# Patient Record
Sex: Male | Born: 1990
Health system: Southern US, Community
[De-identification: ages and names within clinical notes are randomized; demographics above are authoritative.]

## PROBLEM LIST (undated history)

## (undated) DIAGNOSIS — J45909 Unspecified asthma, uncomplicated: Secondary | ICD-10-CM

---

## 2017-01-06 ENCOUNTER — Emergency Department (HOSPITAL_COMMUNITY): Admission: EM | Admit: 2017-01-06 | Discharge: 2017-01-06 | Discharge status: Against Medical Advice | Payer: Self-pay

## 2017-01-06 ENCOUNTER — Other Ambulatory Visit: Payer: Self-pay

## 2017-03-17 ENCOUNTER — Emergency Department (HOSPITAL_COMMUNITY): Payer: No Typology Code available for payment source

## 2017-03-17 ENCOUNTER — Emergency Department (HOSPITAL_COMMUNITY): Admission: EM | Admit: 2017-03-17 | Discharge: 2017-03-17 | Discharge status: Absent without leave | Payer: Self-pay

## 2017-03-17 ENCOUNTER — Emergency Department (HOSPITAL_COMMUNITY)
Admission: EM | Admit: 2017-03-17 | Discharge: 2017-03-17 | Disposition: A | Discharge status: Home / Self Care | Payer: No Typology Code available for payment source | Attending: Emergency Medicine | Admitting: Emergency Medicine

## 2017-03-17 DIAGNOSIS — S59291A Other physeal fracture of lower end of radius, right arm, initial encounter for closed fracture: Secondary | ICD-10-CM | POA: Diagnosis not present

## 2017-03-17 DIAGNOSIS — Y929 Unspecified place or not applicable: Secondary | ICD-10-CM | POA: Insufficient documentation

## 2017-03-17 DIAGNOSIS — Y939 Activity, unspecified: Secondary | ICD-10-CM | POA: Insufficient documentation

## 2017-03-17 DIAGNOSIS — S20219A Contusion of unspecified front wall of thorax, initial encounter: Secondary | ICD-10-CM | POA: Insufficient documentation

## 2017-03-17 DIAGNOSIS — S93402A Sprain of unspecified ligament of left ankle, initial encounter: Secondary | ICD-10-CM | POA: Insufficient documentation

## 2017-03-17 DIAGNOSIS — T07XXXA Unspecified multiple injuries, initial encounter: Secondary | ICD-10-CM | POA: Diagnosis present

## 2017-03-17 DIAGNOSIS — Y999 Unspecified external cause status: Secondary | ICD-10-CM | POA: Diagnosis not present

## 2017-03-17 DIAGNOSIS — S62101A Fracture of unspecified carpal bone, right wrist, initial encounter for closed fracture: Secondary | ICD-10-CM

## 2017-03-17 LAB — CBC WITH DIFFERENTIAL/PLATELET
BASOS ABS: 0.1 10*3/uL (ref 0.0–0.1)
Basophils Relative: 0 %
Eosinophils Absolute: 0 10*3/uL (ref 0.0–0.7)
Eosinophils Relative: 0 %
HCT: 43.9 % (ref 39.0–52.0)
HEMOGLOBIN: 15 g/dL (ref 13.0–17.0)
LYMPHS ABS: 1.3 10*3/uL (ref 0.7–4.0)
LYMPHS PCT: 6 %
MCH: 31.7 pg (ref 26.0–34.0)
MCHC: 34.2 g/dL (ref 30.0–36.0)
MCV: 92.8 fL (ref 78.0–100.0)
Monocytes Absolute: 1.7 10*3/uL — ABNORMAL HIGH (ref 0.1–1.0)
Monocytes Relative: 7 %
NEUTROS PCT: 87 %
Neutro Abs: 20.1 10*3/uL — ABNORMAL HIGH (ref 1.7–7.7)
Platelets: 215 10*3/uL (ref 150–400)
RBC: 4.73 MIL/uL (ref 4.22–5.81)
RDW: 14.4 % (ref 11.5–15.5)
WBC: 23.1 10*3/uL — ABNORMAL HIGH (ref 4.0–10.5)

## 2017-03-17 LAB — BASIC METABOLIC PANEL
ANION GAP: 12 (ref 5–15)
BUN: 13 mg/dL (ref 6–20)
CHLORIDE: 106 mmol/L (ref 101–111)
CO2: 20 mmol/L — ABNORMAL LOW (ref 22–32)
Calcium: 9 mg/dL (ref 8.9–10.3)
Creatinine, Ser: 0.88 mg/dL (ref 0.61–1.24)
GFR calc Af Amer: >60 mL/min (ref >60)
GLUCOSE: 87 mg/dL (ref 65–99)
POTASSIUM: 3.7 mmol/L (ref 3.5–5.1)
SODIUM: 138 mmol/L (ref 135–145)

## 2017-03-17 MED ORDER — NAPROXEN 500 MG PO TABS: 500.0000 mg | ORAL_TABLET | Freq: Two times a day (BID) | ORAL | 0 refills | Status: DC

## 2017-03-17 MED ORDER — OXYCODONE-ACETAMINOPHEN 5-325 MG PO TABS: 2.0000 | ORAL_TABLET | ORAL | 0 refills | Status: DC | PRN

## 2017-03-17 MED ORDER — HYDROCODONE-ACETAMINOPHEN 5-325 MG PO TABS: 1.0000 | ORAL_TABLET | ORAL | 0 refills | Status: DC | PRN

## 2017-03-17 MED ORDER — HYDROCODONE-ACETAMINOPHEN 5-325 MG PO TABS
2.0000 | ORAL_TABLET | Freq: Once | ORAL | Status: AC
  Administered 2017-03-17: 2 via ORAL
  Filled 2017-03-17: qty 2

## 2017-03-17 MED ORDER — FENTANYL CITRATE (PF) 100 MCG/2ML IJ SOLN
100.0000 ug | INTRAMUSCULAR | Status: AC | PRN
  Administered 2017-03-17 (×2): 100 ug via INTRAVENOUS
  Filled 2017-03-17 (×2): qty 2

## 2017-03-17 MED ORDER — ONDANSETRON HCL 4 MG/2ML IJ SOLN
4.0000 mg | Freq: Once | INTRAMUSCULAR | Status: AC
  Administered 2017-03-17: 4 mg via INTRAVENOUS
  Filled 2017-03-17: qty 2

## 2017-03-17 MED ORDER — ONDANSETRON HCL 4 MG/2ML IJ SOLN: 4.0000 mg | Freq: Once | INTRAMUSCULAR | Status: DC

## 2017-03-17 NOTE — Discharge Instructions (Signed)
Wear splint until seen in follow-up by Dr. Amanda PeaGramig.  Call tomorrow for follow-up appointment. Weight-bear as tolerated on left ankle using the Cam walker boot. Naproxen for mild pain, Vicodin for worsening pain.
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
## 2017-03-17 NOTE — ED Notes (Signed)
Bed: [id number] Expected date:  Expected time:  Means of arrival:  Comments: Hold EMS

## 2017-05-08 ENCOUNTER — Emergency Department (HOSPITAL_COMMUNITY)
Admission: EM | Admit: 2017-05-08 | Discharge: 2017-05-08 | Disposition: A | Discharge status: Home / Self Care | Payer: Self-pay | Attending: Emergency Medicine | Admitting: Emergency Medicine

## 2017-05-08 ENCOUNTER — Encounter (HOSPITAL_COMMUNITY): Payer: Self-pay

## 2017-05-08 ENCOUNTER — Emergency Department (HOSPITAL_COMMUNITY): Payer: Self-pay

## 2017-05-08 ENCOUNTER — Other Ambulatory Visit: Payer: Self-pay

## 2017-05-08 DIAGNOSIS — N451 Epididymitis: Secondary | ICD-10-CM | POA: Insufficient documentation

## 2017-05-08 DIAGNOSIS — N50811 Right testicular pain: Secondary | ICD-10-CM

## 2017-05-08 DIAGNOSIS — J45909 Unspecified asthma, uncomplicated: Secondary | ICD-10-CM | POA: Insufficient documentation

## 2017-05-08 DIAGNOSIS — F17210 Nicotine dependence, cigarettes, uncomplicated: Secondary | ICD-10-CM | POA: Insufficient documentation

## 2017-05-08 HISTORY — DX: Unspecified asthma, uncomplicated: J45.909

## 2017-05-08 LAB — URINALYSIS, ROUTINE W REFLEX MICROSCOPIC
BACTERIA UA: NONE SEEN
Bilirubin Urine: NEGATIVE
Glucose, UA: NEGATIVE mg/dL
Ketones, ur: NEGATIVE mg/dL
NITRITE: NEGATIVE
PROTEIN: 100 mg/dL — AB
SQUAMOUS EPITHELIAL / LPF: NONE SEEN
Specific Gravity, Urine: 1.025 (ref 1.005–1.030)
pH: 7 (ref 5.0–8.0)

## 2017-05-08 MED ORDER — DOXYCYCLINE HYCLATE 100 MG PO CAPS: 100.0000 mg | ORAL_CAPSULE | Freq: Two times a day (BID) | ORAL | 0 refills | Status: AC

## 2017-05-08 MED ORDER — DOXYCYCLINE HYCLATE 100 MG PO TABS
100.0000 mg | ORAL_TABLET | Freq: Once | ORAL | Status: AC
  Administered 2017-05-08: 100 mg via ORAL
  Filled 2017-05-08: qty 1

## 2017-05-08 MED ORDER — HYDROMORPHONE HCL 2 MG/ML IJ SOLN
2.0000 mg | Freq: Once | INTRAMUSCULAR | Status: AC
Start: 2017-05-08 — End: 2017-05-08
  Administered 2017-05-08: 2 mg via INTRAVENOUS
  Filled 2017-05-08: qty 1

## 2017-05-08 MED ORDER — HYDROMORPHONE HCL 1 MG/ML IJ SOLN
1.0000 mg | Freq: Once | INTRAMUSCULAR | Status: AC
  Administered 2017-05-08: 1 mg via INTRAVENOUS
  Filled 2017-05-08: qty 1

## 2017-05-08 MED ORDER — NAPROXEN 500 MG PO TABS
500.0000 mg | ORAL_TABLET | Freq: Once | ORAL | Status: AC
  Administered 2017-05-08: 500 mg via ORAL
  Filled 2017-05-08: qty 1

## 2017-05-08 MED ORDER — CEFTRIAXONE SODIUM 250 MG IJ SOLR
250.0000 mg | Freq: Once | INTRAMUSCULAR | Status: AC
  Administered 2017-05-08: 250 mg via INTRAMUSCULAR
  Filled 2017-05-08: qty 250

## 2017-05-08 MED ORDER — HYDROCODONE-ACETAMINOPHEN 5-325 MG PO TABS: 1.0000 | ORAL_TABLET | ORAL | 0 refills | Status: DC | PRN

## 2017-05-08 MED ORDER — STERILE WATER FOR INJECTION IJ SOLN
INTRAMUSCULAR | Status: AC
  Administered 2017-05-08: 0.9 mL
  Filled 2017-05-08: qty 10

## 2017-05-08 MED ORDER — OXYCODONE-ACETAMINOPHEN 5-325 MG PO TABS
2.0000 | ORAL_TABLET | Freq: Once | ORAL | Status: AC
  Administered 2017-05-08: 2 via ORAL
  Filled 2017-05-08: qty 2

## 2017-05-08 MED ORDER — ONDANSETRON 8 MG PO TBDP
8.0000 mg | ORAL_TABLET | Freq: Once | ORAL | Status: AC
  Administered 2017-05-08: 8 mg via ORAL
  Filled 2017-05-08: qty 1

## 2017-05-08 MED ORDER — IBUPROFEN 600 MG PO TABS: 600.0000 mg | ORAL_TABLET | Freq: Four times a day (QID) | ORAL | 0 refills | Status: DC | PRN

## 2017-05-08 NOTE — ED Provider Notes (Signed)
Assumed care at 1500. Patient imaging shows epididymitis. No signs of systemic illness.  Patient given prophylactic antibiotics here and will discharge with outpatient referral.  I discussed the importance of adhering to his full antibiotic regimen and need for outpatient follow-up, as well as STD precautions.  Patient in agreement.  Pt phone: [phone]   Shaune PollackIsaacs, Trezure Cronk, MD 05/08/17 [id number]

## 2017-05-08 NOTE — ED Triage Notes (Signed)
Pt presents today due to testicular pain and swelling since last night. Pt reports waking from his sleep due to pain. Has not taken anything OTC for pain.

## 2017-05-08 NOTE — ED Notes (Signed)
Gave pt a urinal 

## 2017-05-08 NOTE — Discharge Instructions (Signed)
As we discussed, it is very important that you fill and take your antibiotics.  This can prevent serious complications.  Call 1 of the numbers above to set up an appointment with your primary doctor as well as seek assistance with medications as needed.
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
## 2017-05-08 NOTE — ED Notes (Signed)
ULTRASOUND AT BEDSIDE

## 2017-05-08 NOTE — ED Notes (Signed)
ED Provider at bedside. 

## 2017-05-09 LAB — URINE CULTURE: CULTURE: NO GROWTH

## 2017-05-11 ENCOUNTER — Encounter (HOSPITAL_COMMUNITY): Payer: Self-pay | Admitting: *Deleted

## 2017-05-11 ENCOUNTER — Emergency Department (HOSPITAL_COMMUNITY): Payer: Self-pay

## 2017-05-11 DIAGNOSIS — N50811 Right testicular pain: Secondary | ICD-10-CM | POA: Insufficient documentation

## 2017-05-11 DIAGNOSIS — Z5321 Procedure and treatment not carried out due to patient leaving prior to being seen by health care provider: Secondary | ICD-10-CM | POA: Insufficient documentation

## 2017-05-11 NOTE — ED Triage Notes (Signed)
Pt complains of swelling to his right testicle radiating into his right groin for the past 2-3 days. Pt states pain is worse with cough. Pt denies urinary symptoms.

## 2017-05-11 NOTE — ED Notes (Signed)
Called Pt for ultrasound no response from the lobby.

## 2017-05-11 NOTE — ED Notes (Signed)
Called Pt in lobby for vital recheck, no response in lobby x2. 

## 2017-05-12 ENCOUNTER — Emergency Department (HOSPITAL_COMMUNITY)
Admission: EM | Admit: 2017-05-12 | Discharge: 2017-05-12 | Disposition: A | Discharge status: Home / Self Care | Payer: Self-pay | Attending: Emergency Medicine | Admitting: Emergency Medicine

## 2017-05-12 NOTE — ED Notes (Signed)
Pt called. No response.

## 2017-05-14 NOTE — ED Notes (Signed)
05/14/2017, Attempted follow-up call, no answer. 

## 2017-10-30 ENCOUNTER — Other Ambulatory Visit: Payer: Self-pay

## 2017-10-30 ENCOUNTER — Emergency Department (HOSPITAL_COMMUNITY)
Admission: EM | Admit: 2017-10-30 | Discharge: 2017-10-30 | Disposition: A | Discharge status: Home / Self Care | Payer: Self-pay | Attending: Emergency Medicine | Admitting: Emergency Medicine

## 2017-10-30 ENCOUNTER — Emergency Department (HOSPITAL_COMMUNITY): Payer: Self-pay

## 2017-10-30 ENCOUNTER — Encounter (HOSPITAL_COMMUNITY): Payer: Self-pay | Admitting: Emergency Medicine

## 2017-10-30 DIAGNOSIS — F17210 Nicotine dependence, cigarettes, uncomplicated: Secondary | ICD-10-CM | POA: Insufficient documentation

## 2017-10-30 DIAGNOSIS — J45909 Unspecified asthma, uncomplicated: Secondary | ICD-10-CM | POA: Insufficient documentation

## 2017-10-30 DIAGNOSIS — R079 Chest pain, unspecified: Secondary | ICD-10-CM | POA: Insufficient documentation

## 2017-10-30 DIAGNOSIS — R05 Cough: Secondary | ICD-10-CM | POA: Insufficient documentation

## 2017-10-30 DIAGNOSIS — R071 Chest pain on breathing: Secondary | ICD-10-CM | POA: Insufficient documentation

## 2017-10-30 LAB — BASIC METABOLIC PANEL
Anion gap: 6 (ref 5–15)
BUN: 13 mg/dL (ref 6–20)
CO2: 24 mmol/L (ref 22–32)
Calcium: 9.4 mg/dL (ref 8.9–10.3)
Chloride: 111 mmol/L (ref 98–111)
Creatinine, Ser: 1.06 mg/dL (ref 0.61–1.24)
GFR calc Af Amer: >60 mL/min (ref >60)
GFR calc non Af Amer: >60 mL/min (ref >60)
Glucose, Bld: 101 mg/dL — ABNORMAL HIGH (ref 70–99)
Potassium: 4 mmol/L (ref 3.5–5.1)
Sodium: 141 mmol/L (ref 135–145)

## 2017-10-30 LAB — CBC
HCT: 45.7 % (ref 39.0–52.0)
Hemoglobin: 15.6 g/dL (ref 13.0–17.0)
MCH: 31.9 pg (ref 26.0–34.0)
MCHC: 34.1 g/dL (ref 30.0–36.0)
MCV: 93.5 fL (ref 78.0–100.0)
Platelets: 227 10*3/uL (ref 150–400)
RBC: 4.89 MIL/uL (ref 4.22–5.81)
RDW: 13.9 % (ref 11.5–15.5)
WBC: 8.8 10*3/uL (ref 4.0–10.5)

## 2017-10-30 LAB — I-STAT TROPONIN, ED: Troponin i, poc: 0 ng/mL (ref 0.00–0.08)

## 2017-10-30 LAB — D-DIMER, QUANTITATIVE (NOT AT ARMC): D-Dimer, Quant: 0.27 ug/mL-FEU (ref 0.00–0.50)

## 2017-10-30 MED ORDER — MELOXICAM 15 MG PO TABS: 15.0000 mg | ORAL_TABLET | Freq: Every day | ORAL | 0 refills | Status: AC | PRN

## 2017-10-30 MED ORDER — OXYCODONE-ACETAMINOPHEN 5-325 MG PO TABS: 1.0000 | ORAL_TABLET | ORAL | 0 refills | Status: DC | PRN

## 2017-10-30 MED ORDER — HYDROMORPHONE HCL 1 MG/ML IJ SOLN
1.0000 mg | Freq: Once | INTRAMUSCULAR | Status: AC
Start: 2017-10-30 — End: 2017-10-30
  Administered 2017-10-30: 1 mg via INTRAVENOUS
  Filled 2017-10-30: qty 1

## 2017-10-30 MED ORDER — SODIUM CHLORIDE 0.9 % IV SOLN
INTRAVENOUS | Status: DC
  Administered 2017-10-30: 09:00:00 via INTRAVENOUS

## 2017-10-30 MED ORDER — PREDNISONE 20 MG PO TABS: 40.0000 mg | ORAL_TABLET | Freq: Every day | ORAL | 0 refills | Status: DC

## 2017-10-30 MED ORDER — DEXAMETHASONE SODIUM PHOSPHATE 10 MG/ML IJ SOLN
10.0000 mg | Freq: Once | INTRAMUSCULAR | Status: AC
  Administered 2017-10-30: 10 mg via INTRAVENOUS
  Filled 2017-10-30: qty 1

## 2017-10-30 MED ORDER — KETOROLAC TROMETHAMINE 15 MG/ML IJ SOLN
15.0000 mg | Freq: Once | INTRAMUSCULAR | Status: AC
  Administered 2017-10-30: 15 mg via INTRAVENOUS
  Filled 2017-10-30: qty 1

## 2017-10-30 NOTE — ED Provider Notes (Signed)
[facility]-EMERGENCY DEPT Provider Note   CSN: [id number] Arrival date & time: 10/30/17  4098     History   Chief Complaint Chief Complaint  Patient presents with  . Chest Pain    HPI Kenneth Dean is a 27 y.o. male.  HPI    27 year old male with chest pain.  Onset about a week ago.  Progressively worsening. Associated nonproductive cough.  Describes the pain as sharp.  Worse with certain movements, coughing and deep breathing.  He has a past history of asthma but states that the symptoms feel different.  He is tried using his albuterol inhaler without improvement.  No fevers or chills.  No unusual leg pain or swelling.  Past Medical History:  Diagnosis Date  . Asthma     There are no active problems to display for this patient.   History reviewed. No pertinent surgical history.      Home Medications    Prior to Admission medications   Not on File    Family History No family history on file.  Social History Social History   Tobacco Use  . Smoking status: Current Every Day Smoker    Packs/day: 1.00    Types: Cigarettes  . Smokeless tobacco: Never Used  Substance Use Topics  . Alcohol use: Never    Frequency: Never  . Drug use: Never     Allergies   Patient has no known allergies.   Review of Systems Review of Systems  All systems reviewed and negative, other than as noted in HPI.  Physical Exam Updated Vital Signs BP (!) 130/95   Pulse (!) 56   Temp 98.4 F (36.9 C) (Oral)   Resp 17   Ht 5\' 5"  (1.651 m)   Wt 74.8 kg   SpO2 96%   BMI 27.46 kg/m   Physical Exam  Constitutional: He appears well-developed and well-nourished. No distress.  HENT:  Head: Normocephalic and atraumatic.  Eyes: Conjunctivae are normal. Right eye exhibits no discharge. Left eye exhibits no discharge.  Neck: Neck supple.  Cardiovascular: Normal rate, regular rhythm and normal heart sounds. Exam reveals no gallop and no friction rub.  No  murmur heard. Pulmonary/Chest: Effort normal and breath sounds normal. No respiratory distress.  Abdominal: Soft. He exhibits no distension. There is no tenderness.  Musculoskeletal: He exhibits no edema or tenderness.  Lower extremities symmetric as compared to each other. No calf tenderness. Negative Homan's. No palpable cords.   Neurological: He is alert.  Skin: Skin is warm and dry.  Psychiatric: He has a normal mood and affect. His behavior is normal. Thought content normal.  Nursing note and vitals reviewed.    ED Treatments / Results  Labs (all labs ordered are listed, but only abnormal results are displayed) Labs Reviewed  BASIC METABOLIC PANEL - Abnormal; Notable for the following components:      Result Value   Glucose, Bld 101 (*)    All other components within normal limits  CBC  D-DIMER, QUANTITATIVE (NOT AT [facility])  I-STAT TROPONIN, ED    EKG EKG Interpretation  Date/Time:  Friday October 30 2017 08:37:25 EDT Ventricular Rate:  76 PR Interval:    QRS Duration: 82 QT Interval:  350 QTC Calculation: 394 R Axis:   71 Text Interpretation:  Sinus arrhythmia Confirmed by Raeford Razor [id number]) on 10/30/2017 8:52:29 AM   Radiology Dg Chest 2 View  Result Date: 10/30/2017 CLINICAL DATA:  Right chest pain EXAM: CHEST - 2 VIEW COMPARISON:  03/17/2017 FINDINGS: Heart and mediastinal contours are within normal limits. No focal opacities or effusions. No acute bony abnormality. IMPRESSION: No active cardiopulmonary disease. Electronically Signed   By: Charlett Nose M.D.   On: 10/30/2017 09:26    Procedures Procedures (including critical care time)  Medications Ordered in ED Medications  0.9 %  sodium chloride infusion ( Intravenous New Bag/Given (Non-Interop) 10/30/17 0900)  ketorolac (TORADOL) 15 MG/ML injection 15 mg (15 mg Intravenous Given 10/30/17 0900)  HYDROmorphone (DILAUDID) injection 1 mg (1 mg Intravenous Given 10/30/17 0902)  dexamethasone (DECADRON)  injection 10 mg (10 mg Intravenous Given 10/30/17 1000)     Initial Impression / Assessment and Plan / ED Course  I have reviewed the triage vital signs and the nursing notes.  Pertinent labs & imaging results that were available during my care of the patient were reviewed by me and considered in my medical decision making (see chart for details).     27 year old male with chest pain.  Pleuritic component.  Afebrile.  O2 saturations fine.  He sounds clear on exam.  Chest x-ray without acute abnormality.  D-dimer is normal.  Atypical symptoms for ACS. Plan PRN NSAIDs. Possible pleurisy versus chest wall pain with how reproducible it is.   Final Clinical Impressions(s) / ED Diagnoses   Final diagnoses:  Chest pain, unspecified type    ED Discharge Orders         Ordered    predniSONE (DELTASONE) 20 MG tablet  Daily,   Status:  Discontinued     10/30/17 0940    oxyCODONE-acetaminophen (PERCOCET/ROXICET) 5-325 MG tablet  Every 4 hours PRN,   Status:  Discontinued     10/30/17 0940           Raeford Razor, MD 11/02/17 1428

## 2017-10-30 NOTE — ED Notes (Signed)
Patient transported to X-ray 

## 2017-10-30 NOTE — ED Triage Notes (Signed)
Pt c/o right chest pain for week. Reports that had cough x 4 days that is non productive. Has knot on right chest where pain is and repots that pain is worse when taking breaths or movement of right side.

## 2019-01-07 ENCOUNTER — Other Ambulatory Visit: Payer: Self-pay

## 2019-01-07 ENCOUNTER — Encounter (HOSPITAL_COMMUNITY): Payer: Self-pay

## 2019-01-07 ENCOUNTER — Ambulatory Visit (HOSPITAL_COMMUNITY)
Admission: EM | Admit: 2019-01-07 | Discharge: 2019-01-07 | Disposition: A | Discharge status: Home / Self Care | Payer: Self-pay | Attending: Family Medicine | Admitting: Family Medicine

## 2019-01-07 DIAGNOSIS — K047 Periapical abscess without sinus: Secondary | ICD-10-CM

## 2019-01-07 MED ORDER — CEFTRIAXONE SODIUM 1 G IJ SOLR
INTRAMUSCULAR | Status: AC
  Filled 2019-01-07: qty 10

## 2019-01-07 MED ORDER — PENICILLIN V POTASSIUM 500 MG PO TABS: 500.0000 mg | ORAL_TABLET | Freq: Three times a day (TID) | ORAL | 0 refills | Status: DC

## 2019-01-07 MED ORDER — LIDOCAINE HCL (PF) 1 % IJ SOLN
INTRAMUSCULAR | Status: AC
  Filled 2019-01-07: qty 4

## 2019-01-07 MED ORDER — CEFTRIAXONE SODIUM 1 G IJ SOLR
1.0000 g | Freq: Once | INTRAMUSCULAR | Status: AC
  Administered 2019-01-07: 1 g via INTRAMUSCULAR

## 2019-01-07 MED FILL — ?PENICILLIN VK 500 MG TABLE: 500 | 10 days supply | Qty: 30 | Fill #0

## 2019-01-07 NOTE — ED Provider Notes (Signed)
[facility]    CSN: [id number] Arrival date & time: 01/07/19  6837      History   Chief Complaint Chief Complaint  Patient presents with  . Dental Pain    HPI Kenneth Dean is a 28 y.o. male.   Initial [facility] visit.  Pt. States he has had dental pain for 2 months now, he has been self medicating.      Past Medical History:  Diagnosis Date  . Asthma     There are no active problems to display for this patient.   History reviewed. No pertinent surgical history.     Home Medications    Prior to Admission medications   Medication Sig Start Date End Date Taking? Authorizing Provider  meloxicam (MOBIC) 15 MG tablet Take 1 tablet (15 mg total) by mouth daily as needed for pain. 10/30/17   Raeford Razor, MD  penicillin v potassium (VEETID) 500 MG tablet Take 1 tablet (500 mg total) by mouth 3 (three) times daily. 01/07/19   Elvina Sidle, MD    Family History Family History  Problem Relation Age of Onset  . Asthma Mother   . Bipolar disorder Father     Social History Social History   Tobacco Use  . Smoking status: Current Every Day Smoker    Packs/day: 1.00    Types: Cigarettes  . Smokeless tobacco: Never Used  Substance Use Topics  . Alcohol use: Never    Frequency: Never  . Drug use: Never     Allergies   Patient has no known allergies.   Review of Systems Review of Systems  HENT: Positive for dental problem.   All other systems reviewed and are negative.    Physical Exam Triage Vital Signs ED Triage Vitals  Enc Vitals Group     BP 01/07/19 0946 131/88     Pulse Rate 01/07/19 0946 82     Resp 01/07/19 0946 19     Temp 01/07/19 0946 98.2 F (36.8 C)     Temp Source 01/07/19 0946 Oral     SpO2 01/07/19 0946 99 %     Weight --      Height --      Head Circumference --      Peak Flow --      Pain Score 01/07/19 0944 10     Pain Loc --      Pain Edu? --      Excl. in GC? --    No data found.  Updated Vital Signs BP  131/88 (BP Location: Left Arm)   Pulse 82   Temp 98.2 F (36.8 C) (Oral)   Resp 19   SpO2 99%    Physical Exam Vitals signs and nursing note reviewed.  Constitutional:      Appearance: Normal appearance. He is normal weight.  HENT:     Mouth/Throat:     Comments: Large cavity #29 with gum and right jaw swelling Neck:     Musculoskeletal: Normal range of motion and neck supple.  Cardiovascular:     Rate and Rhythm: Normal rate.  Pulmonary:     Effort: Pulmonary effort is normal.     Breath sounds: Normal breath sounds.  Musculoskeletal: Normal range of motion.  Skin:    General: Skin is warm.  Neurological:     General: No focal deficit present.     Mental Status: He is alert.  Psychiatric:        Mood and Affect: Mood normal.  UC Treatments / Results  Labs (all labs ordered are listed, but only abnormal results are displayed) Labs Reviewed - No data to display  EKG   Radiology No results found.  Procedures Procedures (including critical care time)  Medications Ordered in UC Medications  cefTRIAXone (ROCEPHIN) injection 1 g (has no administration in time range)    Initial Impression / Assessment and Plan / UC Course  I have reviewed the triage vital signs and the nursing notes.  Pertinent labs & imaging results that were available during my care of the patient were reviewed by me and considered in my medical decision making (see chart for details).    Final Clinical Impressions(s) / UC Diagnoses   Final diagnoses:  Dental abscess   Discharge Instructions   None    ED Prescriptions    Medication Sig Dispense Auth. Provider   penicillin v potassium (VEETID) 500 MG tablet Take 1 tablet (500 mg total) by mouth 3 (three) times daily. 30 tablet Robyn Haber, MD     I have reviewed the PDMP during this encounter.   Robyn Haber, MD 01/07/19 1001

## 2019-01-07 NOTE — ED Triage Notes (Signed)
Pt. States he has had dental pain for 2 months now, he has been self medicating.
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
## 2019-08-18 ENCOUNTER — Emergency Department (HOSPITAL_COMMUNITY)
Admission: EM | Admit: 2019-08-18 | Discharge: 2019-08-18 | Disposition: A | Discharge status: Home / Self Care | Payer: Self-pay | Attending: Emergency Medicine | Admitting: Emergency Medicine

## 2019-08-18 ENCOUNTER — Other Ambulatory Visit: Payer: Self-pay

## 2019-08-18 ENCOUNTER — Encounter (HOSPITAL_COMMUNITY): Payer: Self-pay

## 2019-08-18 DIAGNOSIS — W57XXXA Bitten or stung by nonvenomous insect and other nonvenomous arthropods, initial encounter: Secondary | ICD-10-CM | POA: Insufficient documentation

## 2019-08-18 DIAGNOSIS — Y929 Unspecified place or not applicable: Secondary | ICD-10-CM | POA: Insufficient documentation

## 2019-08-18 DIAGNOSIS — S31109A Unspecified open wound of abdominal wall, unspecified quadrant without penetration into peritoneal cavity, initial encounter: Secondary | ICD-10-CM | POA: Insufficient documentation

## 2019-08-18 DIAGNOSIS — Y939 Activity, unspecified: Secondary | ICD-10-CM | POA: Insufficient documentation

## 2019-08-18 DIAGNOSIS — L989 Disorder of the skin and subcutaneous tissue, unspecified: Secondary | ICD-10-CM

## 2019-08-18 DIAGNOSIS — J45909 Unspecified asthma, uncomplicated: Secondary | ICD-10-CM | POA: Insufficient documentation

## 2019-08-18 DIAGNOSIS — F17210 Nicotine dependence, cigarettes, uncomplicated: Secondary | ICD-10-CM | POA: Insufficient documentation

## 2019-08-18 DIAGNOSIS — Y999 Unspecified external cause status: Secondary | ICD-10-CM | POA: Insufficient documentation

## 2019-08-18 MED ORDER — DOXYCYCLINE HYCLATE 100 MG PO TABS
100.0000 mg | ORAL_TABLET | Freq: Once | ORAL | Status: AC
  Administered 2019-08-18: 22:00:00 100 mg via ORAL
  Filled 2019-08-18: qty 1

## 2019-08-18 MED ORDER — DOXYCYCLINE HYCLATE 100 MG PO CAPS: 100.0000 mg | ORAL_CAPSULE | Freq: Two times a day (BID) | ORAL | 0 refills | Status: DC

## 2019-08-18 NOTE — ED Provider Notes (Signed)
[name]-EMERGENCY DEPT Provider Note   CSN: [id number] Arrival date & time: 08/18/19  1836     History Chief Complaint  Patient presents with  . Insect Bite    Kenneth Dean is a 29 y.o. male.  HPI      Kenneth Dean is a 29 y.o. male, with a history of asthma, presenting to the ED with an area of pain and redness to the right flank.  He first noticed this a few days ago as a small red bump.  He states it has grown during that time and now has bloody discharge with possible purulence extending. He has been cleaning out basements lately and is concerned about possible spider bite. He has been applying hydrocortisone to the area. Denies fever/chills, nausea/vomiting, abdominal pain, or any other complaints.    Past Medical History:  Diagnosis Date  . Asthma     There are no problems to display for this patient.   History reviewed. No pertinent surgical history.     Family History  Problem Relation Age of Onset  . Asthma Mother   . Bipolar disorder Father     Social History   Tobacco Use  . Smoking status: Current Every Day Smoker    Packs/day: 1.00    Types: Cigarettes  . Smokeless tobacco: Never Used  Vaping Use  . Vaping Use: Never used  Substance Use Topics  . Alcohol use: Never  . Drug use: Never    Home Medications Prior to Admission medications   Medication Sig Start Date End Date Taking? Authorizing Provider  doxycycline (VIBRAMYCIN) 100 MG capsule Take 1 capsule (100 mg total) by mouth 2 (two) times daily for 7 days. 08/18/19 08/25/19  Braydin Aloi, Hillard Danker, PA-C  meloxicam (MOBIC) 15 MG tablet Take 1 tablet (15 mg total) by mouth daily as needed for pain. 10/30/17   Raeford Razor, MD    Allergies    Patient has no known allergies.  Review of Systems   Review of Systems  Constitutional: Negative for fever.  Gastrointestinal: Negative for abdominal pain, diarrhea, nausea and vomiting.  Skin: Positive for wound.    Physical  Exam Updated Vital Signs BP 118/79 (BP Location: Right Arm)   Pulse 98   Temp 98.7 F (37.1 C) (Oral)   Resp 16   SpO2 100%   Physical Exam Vitals and nursing note reviewed.  Constitutional:      General: He is not in acute distress.    Appearance: He is well-developed. He is not diaphoretic.  HENT:     Head: Normocephalic and atraumatic.  Eyes:     Conjunctiva/sclera: Conjunctivae normal.  Cardiovascular:     Rate and Rhythm: Normal rate and regular rhythm.  Pulmonary:     Effort: Pulmonary effort is normal.  Abdominal:       Comments: No tenderness throughout the abdomen or flank other than over the area of erythema.  Musculoskeletal:     Cervical back: Neck supple.  Skin:    General: Skin is warm and dry.     Coloration: Skin is not pale.  Neurological:     Mental Status: He is alert.  Psychiatric:        Behavior: Behavior normal.     ED Results / Procedures / Treatments   Labs (all labs ordered are listed, but only abnormal results are displayed) Labs Reviewed - No data to display  EKG None  Radiology No results found.  Procedures Ultrasound ED Soft Tissue  Date/Time: 08/18/2019 9:20 PM Performed by: Anselm Pancoast, PA-C Authorized by: Anselm Pancoast, PA-C   Procedure details:    Indications: localization of abscess and evaluate for cellulitis     Transverse view:  Visualized   Longitudinal view:  Visualized   Images: archived     Limitations:  Patient compliance Location:    Location: abdominal wall     Side:  Right Findings:     abscess present    cellulitis present   (including critical care time)  Medications Ordered in ED Medications  doxycycline (VIBRA-TABS) tablet 100 mg (100 mg Oral Given 08/18/19 2201)    ED Course  I have reviewed the triage vital signs and the nursing notes.  Pertinent labs & imaging results that were available during my care of the patient were reviewed by me and considered in my medical decision making (see  chart for details).    MDM Rules/Calculators/A&P                          Patient presents with an area of erythema and pain to the skin of the right flank.   Small, shallow area of fluid collection noted on bedside ultrasound.  I able to see the full depth of the fluid collection and this appears superficial. Whether this is from an insect bite, spider bite, or other mechanism of wound, it seems as though patient may have developed some cellulitis.  We will initiate antibiotic therapy. He will return for a wound check in 2 to 3 days to check for improvement.  The patient was given instructions for home care as well as return precautions. Patient voices understanding of these instructions, accepts the plan, and is comfortable with discharge.   Final Clinical Impression(s) / ED Diagnoses Final diagnoses:  Skin lesion    Rx / DC Orders ED Discharge Orders         Ordered    doxycycline (VIBRAMYCIN) 100 MG capsule  2 times daily     Discontinue  Reprint     08/18/19 2151           Anselm Pancoast, PA-C 08/18/19 2337    Pricilla Loveless, MD 08/19/19 [id number]

## 2019-08-18 NOTE — ED Triage Notes (Signed)
Pt reports that he was helping his mom move 4 days ago and he noticed that something bit him. He is not sure what it was. Erythematous, raised area noted to R hip. Dark spot noted in the middle. A&Ox4. Serosanguinous drainage noted on patients shirt.

## 2019-08-18 NOTE — Discharge Instructions (Addendum)
Wound Care - Cellulitis and possible Abscess  Cleaning: Clean the wound and surrounding area gently with tap water and mild soap. Rinse well and blot dry. You may shower normally. Soaking the wound in Epsom salt baths for no more than 15 minutes once a day may help rinse out any remaining pus and help with wound healing.  Clean the wound daily to prevent further infection. Do not use cleaners such as hydrogen peroxide or alcohol.   May apply warm compresses to encourage continued drainage.  Scar reduction: Application of a topical antibiotic ointment, such as Neosporin, after the wound has begun to close and heal well can decrease scab formation and reduce scarring. After the wound has healed, application of ointments such as Aquaphor can also reduce scar formation.  The key to scar reduction is keeping the skin well hydrated and supple. Drinking plenty of water throughout the day (At least eight 8oz glasses of water a day) is essential to staying well hydrated.  Please take all of your antibiotics until finished!   You may develop abdominal discomfort or diarrhea from the antibiotic.  You may help offset this with probiotics which you can buy or get in yogurt. Do not eat or take the probiotics until 2 hours after your antibiotic.   Sun exposure: Keep the wound out of the sun. After the wound has healed, continue to protect it from the sun by wearing protective clothing or applying sunscreen.  Pain: You may use Tylenol, naproxen, or ibuprofen for pain.  Prevention: There are some people who have a predisposition to abscess formation, however, there are some things that can be done to prevent abscesses in many people.  Most abscesses form because bacteria that naturally lives on the skin gets trapped underneath the skin.  This can occur through openings too small to see. Before and after any area of skin is shaved, wax, or abraded in any manner, the area should be washed with soap and water and  rinsed well.   If you are having trouble with recurrent abscesses, it may be wise to perform a chlorhexidine wash regimen.  For 1 week, wash all of your body with chlorhexidine (available over-the-counter at most pharmacies). You may also need to reevaluate your use of daily soap as soaps with perfumes or dyes can increase the chances of infection in some people.  Follow up: Please return to the ED or go to your primary care provider in 2-3 days for a wound check to assure proper healing.  Return: Return to the ED sooner should signs of worsening infection arise, such as spreading redness, worsening puffiness/swelling, severe increase in pain, fever over 100.83F, or any other major issues.  For prescription assistance, may try using prescription discount sites or apps, such as goodrx.com

## 2019-08-20 ENCOUNTER — Emergency Department (HOSPITAL_COMMUNITY)
Admission: EM | Admit: 2019-08-20 | Discharge: 2019-08-20 | Payer: Self-pay | Attending: Emergency Medicine | Admitting: Emergency Medicine

## 2019-08-20 ENCOUNTER — Other Ambulatory Visit: Payer: Self-pay

## 2019-08-20 ENCOUNTER — Encounter (HOSPITAL_COMMUNITY): Payer: Self-pay

## 2019-08-20 DIAGNOSIS — L0291 Cutaneous abscess, unspecified: Secondary | ICD-10-CM

## 2019-08-20 DIAGNOSIS — F17210 Nicotine dependence, cigarettes, uncomplicated: Secondary | ICD-10-CM | POA: Insufficient documentation

## 2019-08-20 DIAGNOSIS — J45909 Unspecified asthma, uncomplicated: Secondary | ICD-10-CM | POA: Insufficient documentation

## 2019-08-20 DIAGNOSIS — Z23 Encounter for immunization: Secondary | ICD-10-CM | POA: Insufficient documentation

## 2019-08-20 DIAGNOSIS — L02211 Cutaneous abscess of abdominal wall: Secondary | ICD-10-CM | POA: Insufficient documentation

## 2019-08-20 MED ORDER — DOXYCYCLINE HYCLATE 100 MG PO CAPS: 100.0000 mg | ORAL_CAPSULE | Freq: Two times a day (BID) | ORAL | 0 refills | Status: AC

## 2019-08-20 MED ORDER — LIDOCAINE-EPINEPHRINE 2 %-1:100000 IJ SOLN
5.0000 mL | Freq: Once | INTRAMUSCULAR | Status: AC
  Administered 2019-08-20: 07:00:00 5 mL via INTRADERMAL
  Filled 2019-08-20: qty 1

## 2019-08-20 MED ORDER — TETANUS-DIPHTH-ACELL PERTUSSIS 5-2.5-18.5 LF-MCG/0.5 IM SUSP
0.5000 mL | Freq: Once | INTRAMUSCULAR | Status: AC
  Administered 2019-08-20: 0.5 mL via INTRAMUSCULAR
  Filled 2019-08-20: qty 0.5
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
## 2019-08-20 NOTE — ED Triage Notes (Signed)
Pt presents with [employer] after [facility] for insect even though pt was seen and cleared yesterday.

## 2019-08-20 NOTE — Discharge Instructions (Addendum)
Please read the attachment on skin abscesses.  Please take the printed Keflex prescription and take as directed.  I encourage you to keep the area clean.  Apply warm compresses (warm wet facecloth) to the affected area 4-5 times daily until symptoms improve.  You will need to follow-up with a provider in the next 2 to 3 days for wound recheck.  Return to the ED or seek immediate medical attention should experience any new or worsening symptoms.

## 2019-08-20 NOTE — ED Provider Notes (Signed)
[facility]-EMERGENCY DEPT Provider Note   CSN: [id number] Arrival date & time: 08/20/19  0510     History Chief Complaint  Patient presents with  . Insect Bite    Kenneth Dean is a 29 y.o. male with no relevant past medical history presents to the ED with complaints of suspected spider bite.  Patient had been evaluated on 08/18/2019 for same complaints.  Soft tissue ultrasound was performed at bedside which revealed a small, superficial fluid collection concerning for abscess with mild surrounding cellulitis.  He was encouraged to return to the ED in 2 to 3 days for wound check and assess for evidence of healing.  No pictures were obtained or size of visualized fluid collection articulated in prior note with which to compare.  On my exam, patient informs me that he has not yet picked up the antibiotic prescription prescribed by prior provider.  He also feels as though the area is getting larger and I can neither confirm nor deny.  While patient's vital signs are stable here in the ED, he is also endorsing feeling "bad".  Patient is accompanied by several police officers as he was transported here from jail.  Evidently they would not let him in until he had this visualized in the ED.  Patient denies any other symptoms.   HPI     Past Medical History:  Diagnosis Date  . Asthma     There are no problems to display for this patient.   History reviewed. No pertinent surgical history.     Family History  Problem Relation Age of Onset  . Asthma Mother   . Bipolar disorder Father     Social History   Tobacco Use  . Smoking status: Current Every Day Smoker    Packs/day: 1.00    Types: Cigarettes  . Smokeless tobacco: Never Used  Vaping Use  . Vaping Use: Never used  Substance Use Topics  . Alcohol use: Never  . Drug use: Never    Home Medications Prior to Admission medications   Medication Sig Start Date End Date Taking? Authorizing Provider    doxycycline (VIBRAMYCIN) 100 MG capsule Take 1 capsule (100 mg total) by mouth 2 (two) times daily for 7 days. 08/20/19 08/27/19  Lorelee New, PA-C  meloxicam (MOBIC) 15 MG tablet Take 1 tablet (15 mg total) by mouth daily as needed for pain. 10/30/17   Raeford Razor, MD    Allergies    Patient has no known allergies.  Review of Systems   Review of Systems  Constitutional: Positive for chills. Negative for fever.  Skin: Positive for color change and wound.  Neurological: Negative for weakness and numbness.  Hematological: Negative for adenopathy. Does not bruise/bleed easily.    Physical Exam Updated Vital Signs BP 136/85 (BP Location: Right Arm)   Pulse 66   Temp 98 F (36.7 C) (Oral)   Resp 16   Ht 5\' 5"  (1.651 m)   Wt 74.8 kg   SpO2 100%   BMI 27.46 kg/m   Physical Exam Vitals and nursing note reviewed. Exam conducted with a chaperone present.  Constitutional:      General: He is not in acute distress.    Appearance: Normal appearance. He is not ill-appearing.  HENT:     Head: Normocephalic and atraumatic.  Eyes:     General: No scleral icterus.    Conjunctiva/sclera: Conjunctivae normal.  Cardiovascular:     Rate and Rhythm: Normal rate and regular rhythm.  Pulses: Normal pulses.     Heart sounds: Normal heart sounds.  Pulmonary:     Effort: Pulmonary effort is normal. No respiratory distress.     Breath sounds: Normal breath sounds.  Skin:    General: Skin is dry.     Capillary Refill: Capillary refill takes less than 2 seconds.     Comments: 1 x 2 cm indurated lesion in RLQ/right flank concerning for abscess.  Area of mild fluctuance and purulence noted superficially.  Surrounding induration and erythema concerning for cellulitis.  Bandaged with duct tape prior to my examination.  Neurological:     Mental Status: He is alert and oriented to person, place, and time.     GCS: GCS eye subscore is 4. GCS verbal subscore is 5. GCS motor subscore is 6.   Psychiatric:        Mood and Affect: Mood normal.        Behavior: Behavior normal.        Thought Content: Thought content normal.        ED Results / Procedures / Treatments   Labs (all labs ordered are listed, but only abnormal results are displayed) Labs Reviewed - No data to display  EKG None  Radiology No results found.  Procedures .Marland KitchenIncision and Drainage  Date/Time: 08/20/2019 7:36 AM Performed by: Lorelee New, PA-C Authorized by: Lorelee New, PA-C   Consent:    Consent obtained:  Verbal   Consent given by:  Patient   Risks discussed:  Bleeding, incomplete drainage, pain, damage to other organs and infection   Alternatives discussed:  No treatment Universal protocol:    Procedure explained and questions answered to patient or proxy's satisfaction: yes     Relevant documents present and verified: yes     Test results available and properly labeled: yes     Imaging studies available: yes     Required blood products, implants, devices, and special equipment available: yes     Site/side marked: yes     Immediately prior to procedure a time out was called: yes     Patient identity confirmed:  Verbally with patient Location:    Type:  Abscess   Location:  Trunk   Trunk location:  Abdomen Pre-procedure details:    Skin preparation:  Betadine Anesthesia (see MAR for exact dosages):    Anesthesia method:  Local infiltration   Local anesthetic:  Lidocaine 1% WITH epi Procedure type:    Complexity:  Simple Procedure details:    Incision types:  Single straight   Incision depth:  Subcutaneous   Scalpel blade:  11   Wound management:  Probed and deloculated, irrigated with saline and extensive cleaning   Drainage:  Purulent and bloody   Drainage amount:  Scant   Packing materials:  None Post-procedure details:    Patient tolerance of procedure:  Tolerated well, no immediate complications   (including critical care time)  Medications Ordered in  ED Medications  lidocaine-EPINEPHrine (XYLOCAINE W/EPI) 2 %-1:100000 (with pres) injection 5 mL (5 mLs Intradermal Given by Other 08/20/19 0726)  Tdap (BOOSTRIX) injection 0.5 mL (0.5 mLs Intramuscular Given 08/20/19 0726)    ED Course  I have reviewed the triage vital signs and the nursing notes.  Pertinent labs & imaging results that were available during my care of the patient were reviewed by me and considered in my medical decision making (see chart for details).    MDM Rules/Calculators/A&P  Patient appears to have a small abscess that would be amenable to drainage.  Will perform incision and drainage here in the ED and encourage patient to fill his prescription of doxycycline.  I have emphasized the importance of antibiotic therapy for his cellulitis.  We will clean the area and dressed appropriately.  Emphasized the importance of applying warm compresses 4-5 times daily to help facilitate continued drainage.  We will also administer Boostrix injection as his last known tetanus vaccine was unknown.    I was able to express some bloody/purulent discharge.  Patient tolerated procedure well.  Abscess is too small for packing.  We will dress wound encourage patient to keep the area clean.  Patient can have wound recheck performed in prison in the next 2-3 days.  Patient did not fill his prescription at pharmacy and is now en route to prison.  Will re-print another doxycyline prescription.  Patient and/or family were informed that while patient is appropriate for discharge at this time, some medical emergencies may only develop or become detectable after a period of time.  I specifically instructed patient and/or family to return to return to the ED or seek immediate medical attention for any new or worsening symptoms.  They were provided opportunity to ask any additional questions and have none at this time.  Prior to discharge patient is feeling well, agreeable with plan  for discharge home.  They have expressed understanding of verbal discharge instructions as well as return precautions and are agreeable to the plan.    Final Clinical Impression(s) / ED Diagnoses Final diagnoses:  Abscess    Rx / DC Orders ED Discharge Orders         Ordered    doxycycline (VIBRAMYCIN) 100 MG capsule  2 times daily     Discontinue  Reprint     08/20/19 0741           Lorelee New, PA-C 08/20/19 0742    Ward, Layla Maw, DO 08/20/19 [phone]

## 2020-02-07 IMAGING — CR DG CHEST 2V
2 series · 2 of 2 positions shown · non-contrast
Comparison: 03/17/2017

CLINICAL DATA: Right chest pain

EXAM:
CHEST - 2 VIEW

[w chest pa]
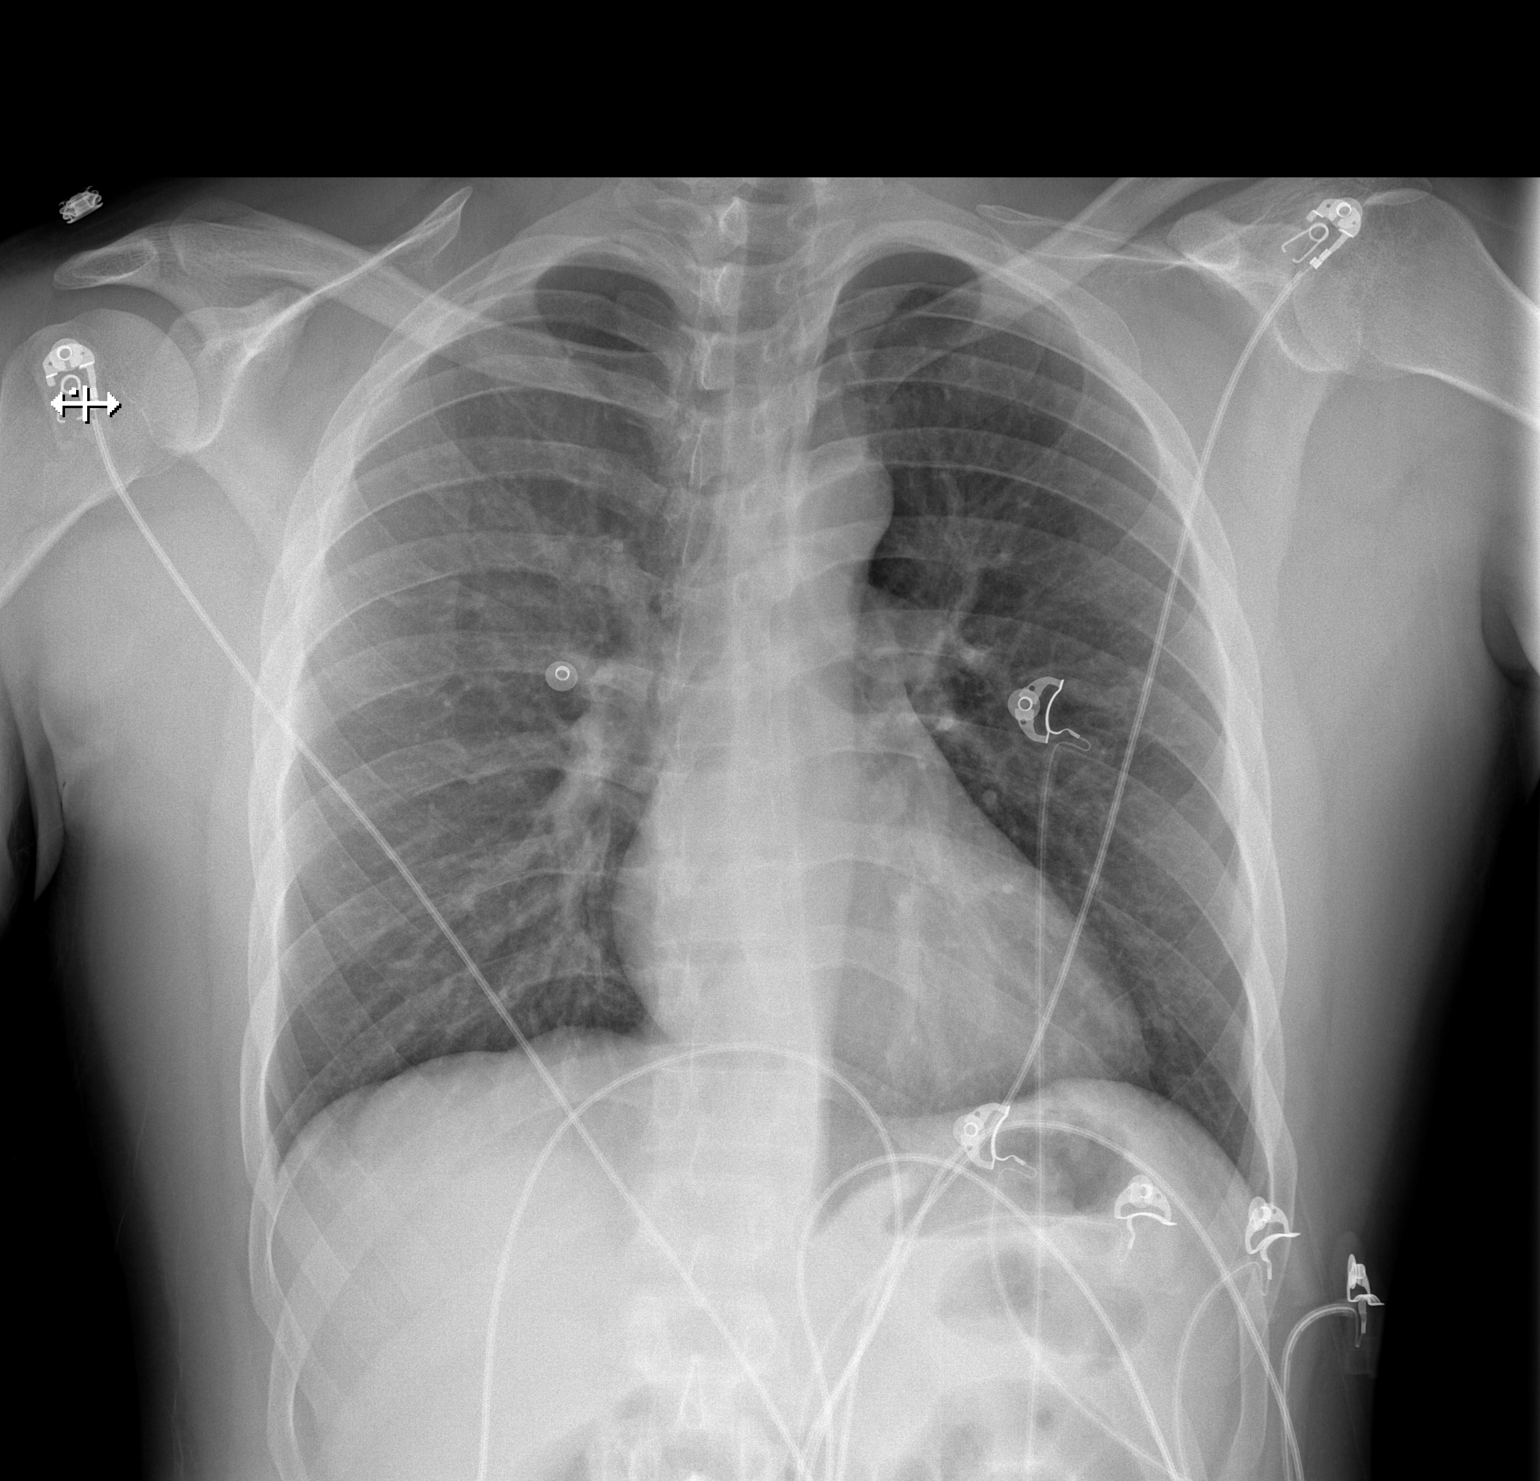

[w chest lat]
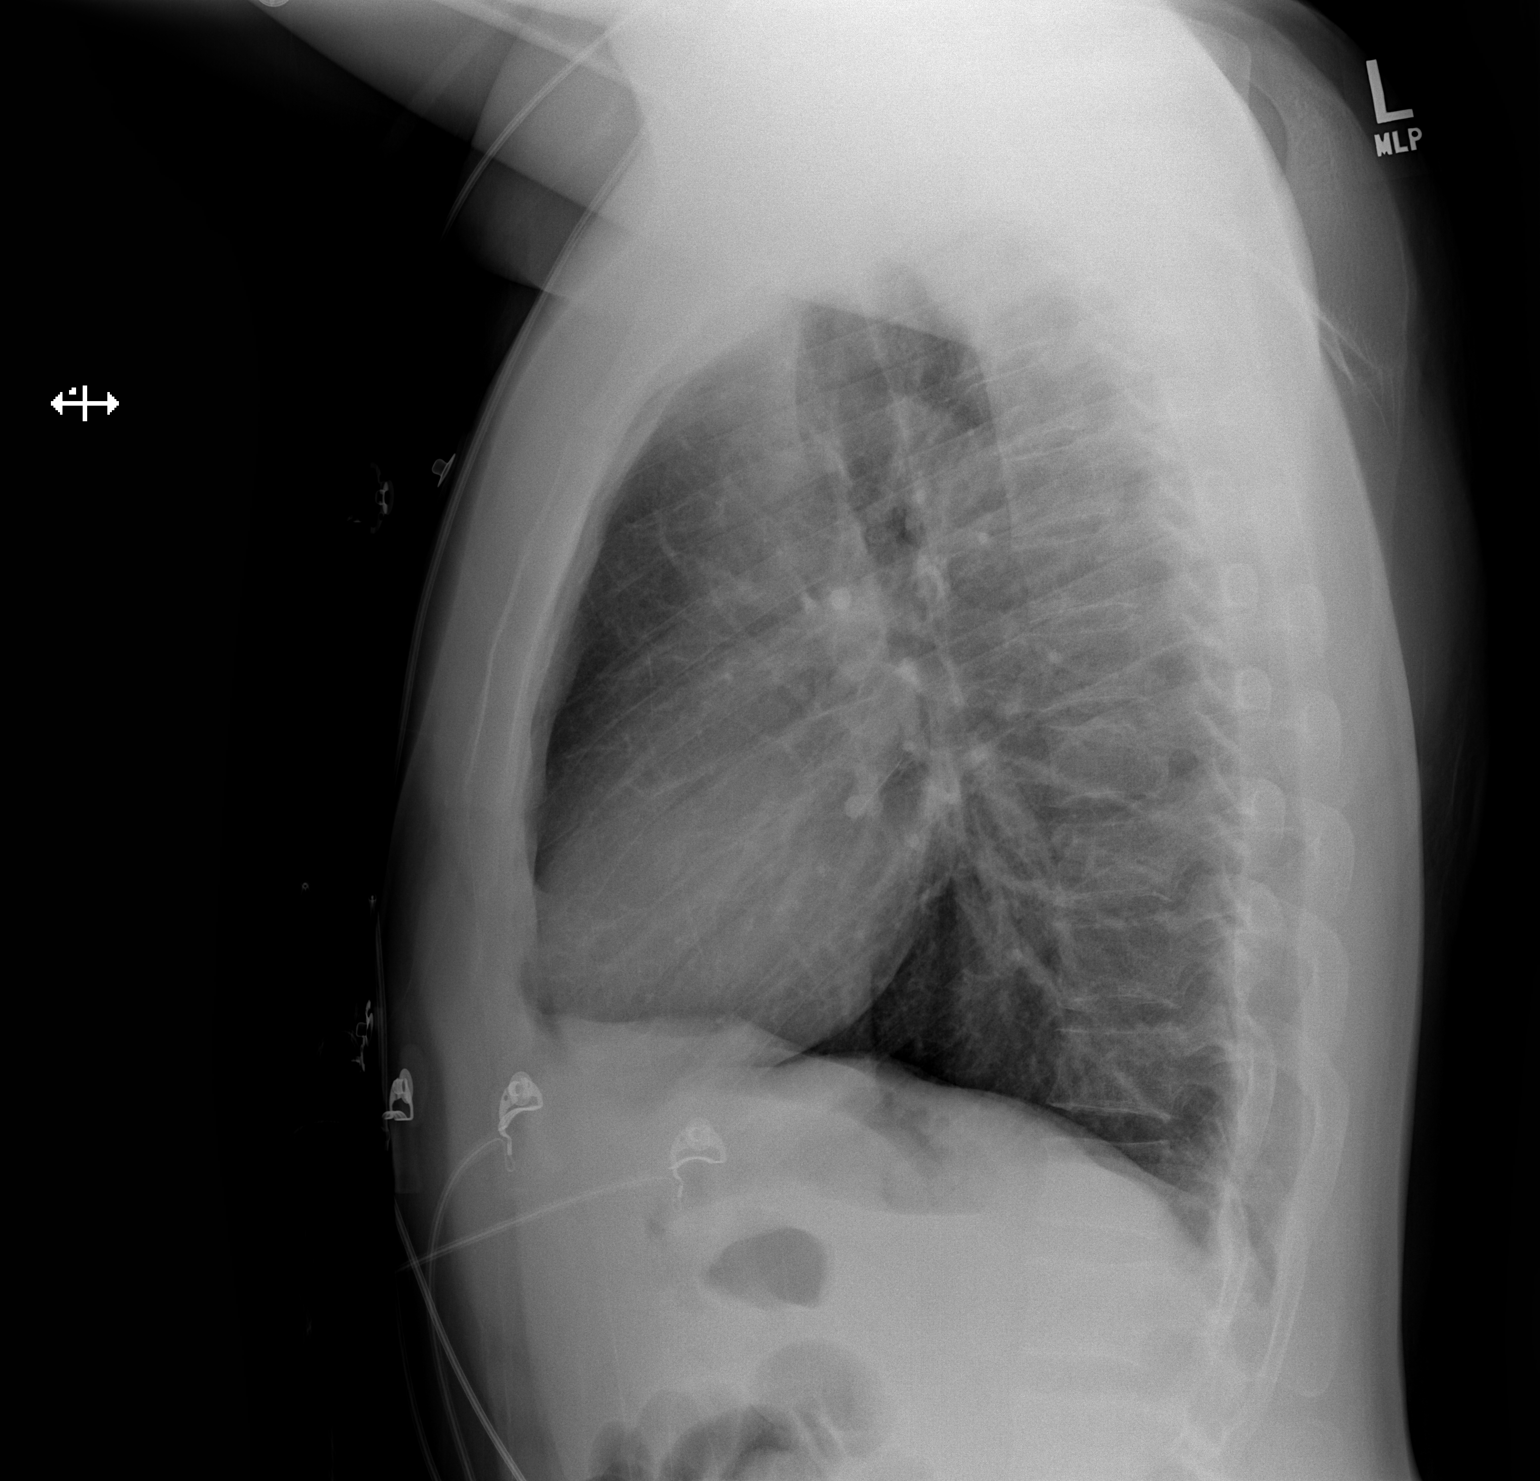

[2 of 2 positions shown; findings below may reference images not displayed]

FINDINGS: Heart and mediastinal contours are within normal limits. No focal
opacities or effusions. No acute bony abnormality.
IMPRESSION: No active cardiopulmonary disease.
